# Patient Record
Sex: Male | Born: 1990 | Race: Black or African American | Hispanic: No | Marital: Single | Smoking: Current every day smoker
Health system: Southern US, Community
[De-identification: ages and names within clinical notes are randomized; demographics above are authoritative.]

## PROBLEM LIST (undated history)

## (undated) HISTORY — PX: CHEST SURGERY: SHX595

---
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
## 2010-08-24 LAB — URINE CULTURE: Culture: NO GROWTH

## 2010-08-24 LAB — URINALYSIS, ROUTINE W REFLEX MICROSCOPIC
Bilirubin Urine: NEGATIVE
Ketones, ur: NEGATIVE mg/dL
Protein, ur: NEGATIVE mg/dL
Urobilinogen, UA: 0.2 mg/dL (ref 0.0–1.0)

## 2010-08-24 LAB — GC/CHLAMYDIA PROBE AMP, GENITAL: Chlamydia, DNA Probe: NEGATIVE

## 2010-10-28 ENCOUNTER — Emergency Department (HOSPITAL_COMMUNITY): Payer: Self-pay

## 2010-10-28 ENCOUNTER — Emergency Department (HOSPITAL_COMMUNITY)
Admission: EM | Admit: 2010-10-28 | Discharge: 2010-10-28 | Disposition: A | Discharge status: Home / Self Care | Payer: Self-pay | Attending: Emergency Medicine | Admitting: Emergency Medicine

## 2010-10-28 DIAGNOSIS — S99919A Unspecified injury of unspecified ankle, initial encounter: Secondary | ICD-10-CM | POA: Insufficient documentation

## 2010-10-28 DIAGNOSIS — M25473 Effusion, unspecified ankle: Secondary | ICD-10-CM | POA: Insufficient documentation

## 2010-10-28 DIAGNOSIS — S8990XA Unspecified injury of unspecified lower leg, initial encounter: Secondary | ICD-10-CM | POA: Insufficient documentation

## 2010-10-28 DIAGNOSIS — M545 Low back pain, unspecified: Secondary | ICD-10-CM | POA: Insufficient documentation

## 2010-10-28 DIAGNOSIS — M25579 Pain in unspecified ankle and joints of unspecified foot: Secondary | ICD-10-CM | POA: Insufficient documentation

## 2010-10-28 DIAGNOSIS — M25569 Pain in unspecified knee: Secondary | ICD-10-CM | POA: Insufficient documentation

## 2010-10-28 DIAGNOSIS — IMO0002 Reserved for concepts with insufficient information to code with codable children: Secondary | ICD-10-CM | POA: Insufficient documentation

## 2010-10-28 DIAGNOSIS — M25476 Effusion, unspecified foot: Secondary | ICD-10-CM | POA: Insufficient documentation

## 2010-11-02 ENCOUNTER — Emergency Department (HOSPITAL_COMMUNITY)
Admission: EM | Admit: 2010-11-02 | Discharge: 2010-11-02 | Disposition: A | Discharge status: Home / Self Care | Payer: No Typology Code available for payment source | Attending: Emergency Medicine | Admitting: Emergency Medicine

## 2010-11-02 ENCOUNTER — Emergency Department (HOSPITAL_COMMUNITY): Payer: Self-pay

## 2010-11-02 DIAGNOSIS — M25579 Pain in unspecified ankle and joints of unspecified foot: Secondary | ICD-10-CM | POA: Insufficient documentation

## 2010-11-02 DIAGNOSIS — S93409A Sprain of unspecified ligament of unspecified ankle, initial encounter: Secondary | ICD-10-CM | POA: Insufficient documentation

## 2010-11-02 DIAGNOSIS — Z09 Encounter for follow-up examination after completed treatment for conditions other than malignant neoplasm: Secondary | ICD-10-CM | POA: Insufficient documentation

## 2010-11-02 DIAGNOSIS — M25529 Pain in unspecified elbow: Secondary | ICD-10-CM | POA: Insufficient documentation

## 2010-11-02 DIAGNOSIS — T148XXA Other injury of unspecified body region, initial encounter: Secondary | ICD-10-CM | POA: Insufficient documentation

## 2010-11-02 DIAGNOSIS — M25569 Pain in unspecified knee: Secondary | ICD-10-CM | POA: Insufficient documentation

## 2010-11-02 DIAGNOSIS — IMO0002 Reserved for concepts with insufficient information to code with codable children: Secondary | ICD-10-CM | POA: Insufficient documentation

## 2010-11-02 DIAGNOSIS — M545 Low back pain, unspecified: Secondary | ICD-10-CM | POA: Insufficient documentation

## 2012-11-17 ENCOUNTER — Emergency Department (HOSPITAL_COMMUNITY): Payer: No Typology Code available for payment source

## 2012-11-17 ENCOUNTER — Emergency Department (HOSPITAL_COMMUNITY)
Admission: EM | Admit: 2012-11-17 | Discharge: 2012-11-17 | Disposition: A | Discharge status: Home / Self Care | Payer: No Typology Code available for payment source | Attending: Emergency Medicine | Admitting: Emergency Medicine

## 2012-11-17 ENCOUNTER — Encounter (HOSPITAL_COMMUNITY): Payer: Self-pay | Admitting: Emergency Medicine

## 2012-11-17 DIAGNOSIS — S80812A Abrasion, left lower leg, initial encounter: Secondary | ICD-10-CM

## 2012-11-17 DIAGNOSIS — Y939 Activity, unspecified: Secondary | ICD-10-CM | POA: Insufficient documentation

## 2012-11-17 DIAGNOSIS — L089 Local infection of the skin and subcutaneous tissue, unspecified: Secondary | ICD-10-CM

## 2012-11-17 DIAGNOSIS — F17200 Nicotine dependence, unspecified, uncomplicated: Secondary | ICD-10-CM | POA: Insufficient documentation

## 2012-11-17 DIAGNOSIS — Z791 Long term (current) use of non-steroidal anti-inflammatories (NSAID): Secondary | ICD-10-CM | POA: Insufficient documentation

## 2012-11-17 DIAGNOSIS — Y9289 Other specified places as the place of occurrence of the external cause: Secondary | ICD-10-CM | POA: Insufficient documentation

## 2012-11-17 DIAGNOSIS — IMO0002 Reserved for concepts with insufficient information to code with codable children: Secondary | ICD-10-CM | POA: Insufficient documentation

## 2012-11-17 MED ORDER — SILVER SULFADIAZINE 1 % EX CREA
TOPICAL_CREAM | Freq: Once | CUTANEOUS | Status: DC
  Filled 2012-11-17: qty 50

## 2012-11-17 MED ORDER — OXYCODONE-ACETAMINOPHEN 5-325 MG PO TABS
1.0000 | ORAL_TABLET | Freq: Once | ORAL | Status: AC
  Administered 2012-11-17: 1 via ORAL
  Filled 2012-11-17: qty 1

## 2012-11-17 MED ORDER — SILVER SULFADIAZINE 1 % EX CREA: TOPICAL_CREAM | Freq: Two times a day (BID) | CUTANEOUS | Status: DC

## 2012-11-17 MED ORDER — IBUPROFEN 800 MG PO TABS: 800.0000 mg | ORAL_TABLET | Freq: Three times a day (TID) | ORAL | Status: DC | PRN

## 2012-11-17 MED ORDER — OXYCODONE-ACETAMINOPHEN 5-325 MG PO TABS: 1.0000 | ORAL_TABLET | Freq: Four times a day (QID) | ORAL | Status: DC | PRN

## 2012-11-17 MED ORDER — SILVER SULFADIAZINE 1 % EX CREA
TOPICAL_CREAM | Freq: Two times a day (BID) | CUTANEOUS | Status: DC
  Administered 2012-11-17: 16:00:00 via TOPICAL

## 2012-11-17 MED ORDER — TETANUS-DIPHTH-ACELL PERTUSSIS 5-2.5-18.5 LF-MCG/0.5 IM SUSP
0.5000 mL | Freq: Once | INTRAMUSCULAR | Status: DC
  Filled 2012-11-17: qty 0.5
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
## 2012-11-17 NOTE — Progress Notes (Signed)
[facility] has seen patient and provided him with a list of primary care resources. °
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
## 2013-01-28 ENCOUNTER — Emergency Department (HOSPITAL_COMMUNITY)
Admission: EM | Admit: 2013-01-28 | Discharge: 2013-01-29 | Disposition: A | Discharge status: Home / Self Care | Payer: Self-pay | Attending: Emergency Medicine | Admitting: Emergency Medicine

## 2013-01-28 DIAGNOSIS — R21 Rash and other nonspecific skin eruption: Secondary | ICD-10-CM | POA: Insufficient documentation

## 2013-01-28 DIAGNOSIS — L299 Pruritus, unspecified: Secondary | ICD-10-CM | POA: Insufficient documentation

## 2013-01-28 DIAGNOSIS — F17200 Nicotine dependence, unspecified, uncomplicated: Secondary | ICD-10-CM | POA: Insufficient documentation

## 2013-01-29 ENCOUNTER — Encounter (HOSPITAL_COMMUNITY): Payer: Self-pay | Admitting: Emergency Medicine

## 2013-01-29 MED ORDER — DIPHENHYDRAMINE HCL 25 MG PO TABS: 25.0000 mg | ORAL_TABLET | Freq: Four times a day (QID) | ORAL | Status: DC

## 2013-01-29 MED ORDER — CEPHALEXIN 500 MG PO CAPS: 500.0000 mg | ORAL_CAPSULE | Freq: Four times a day (QID) | ORAL | Status: DC

## 2013-01-29 NOTE — ED Notes (Signed)
[place], PA at bedside 
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
## 2013-01-29 NOTE — ED Provider Notes (Signed)
Medical screening examination/treatment/procedure(s) were performed by non-physician practitioner and as supervising physician I was immediately available for consultation/collaboration.  Cristofer Yaffe, MD 01/29/13 0415 

## 2013-01-29 NOTE — ED Notes (Signed)
Patient reports on yesterday he noticed a rash on his right foot. It started as small bumps that are now increasing in size. Patient states tonight he noticed they are now also on his Left foot. Patient states rash is itchy and painful to touch. Patient states he was recently in the woods but was wearing shoes. Denies any other potential exposure.

## 2013-01-29 NOTE — ED Notes (Signed)
Patient with small scattered bumps to bilateral ankles, patient states he also feels like they are on his back and butt, none visulaized.

## 2015-03-24 ENCOUNTER — Emergency Department (HOSPITAL_COMMUNITY)
Admission: EM | Admit: 2015-03-24 | Discharge: 2015-03-24 | Disposition: A | Discharge status: Home / Self Care | Payer: No Typology Code available for payment source | Attending: Emergency Medicine | Admitting: Emergency Medicine

## 2015-03-24 ENCOUNTER — Encounter (HOSPITAL_COMMUNITY): Payer: Self-pay | Admitting: Emergency Medicine

## 2015-03-24 DIAGNOSIS — Z720 Tobacco use: Secondary | ICD-10-CM | POA: Insufficient documentation

## 2015-03-24 DIAGNOSIS — Y9389 Activity, other specified: Secondary | ICD-10-CM | POA: Insufficient documentation

## 2015-03-24 DIAGNOSIS — S39012A Strain of muscle, fascia and tendon of lower back, initial encounter: Secondary | ICD-10-CM | POA: Diagnosis not present

## 2015-03-24 DIAGNOSIS — S5001XA Contusion of right elbow, initial encounter: Secondary | ICD-10-CM | POA: Diagnosis not present

## 2015-03-24 DIAGNOSIS — Y998 Other external cause status: Secondary | ICD-10-CM | POA: Diagnosis not present

## 2015-03-24 DIAGNOSIS — S4991XA Unspecified injury of right shoulder and upper arm, initial encounter: Secondary | ICD-10-CM | POA: Diagnosis not present

## 2015-03-24 DIAGNOSIS — Y92410 Unspecified street and highway as the place of occurrence of the external cause: Secondary | ICD-10-CM | POA: Diagnosis not present

## 2015-03-24 DIAGNOSIS — S29012A Strain of muscle and tendon of back wall of thorax, initial encounter: Secondary | ICD-10-CM | POA: Insufficient documentation

## 2015-03-24 DIAGNOSIS — S59901A Unspecified injury of right elbow, initial encounter: Secondary | ICD-10-CM | POA: Diagnosis present

## 2015-03-24 MED ORDER — METHOCARBAMOL 500 MG PO TABS
500.0000 mg | ORAL_TABLET | Freq: Once | ORAL | Status: AC
  Administered 2015-03-24: 500 mg via ORAL
  Filled 2015-03-24: qty 1

## 2015-03-24 MED ORDER — METHOCARBAMOL 500 MG PO TABS: 500.0000 mg | ORAL_TABLET | Freq: Two times a day (BID) | ORAL | Status: DC

## 2015-03-24 MED ORDER — NAPROXEN 500 MG PO TABS: 500.0000 mg | ORAL_TABLET | Freq: Two times a day (BID) | ORAL | Status: DC

## 2015-03-24 MED ORDER — NAPROXEN 500 MG PO TABS
500.0000 mg | ORAL_TABLET | Freq: Once | ORAL | Status: AC
  Administered 2015-03-24: 500 mg via ORAL
  Filled 2015-03-24: qty 1
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
## 2015-03-24 NOTE — ED Notes (Signed)
Ortho Tech called & informed of ace order.  Spoke with Eli Lilly and CompanyBenedict.

## 2015-04-03 ENCOUNTER — Emergency Department (HOSPITAL_COMMUNITY)
Admission: EM | Admit: 2015-04-03 | Discharge: 2015-04-04 | Disposition: A | Discharge status: Home / Self Care | Payer: No Typology Code available for payment source | Attending: Emergency Medicine | Admitting: Emergency Medicine

## 2015-04-03 ENCOUNTER — Encounter (HOSPITAL_COMMUNITY): Payer: Self-pay | Admitting: Emergency Medicine

## 2015-04-03 ENCOUNTER — Emergency Department (HOSPITAL_COMMUNITY): Payer: No Typology Code available for payment source

## 2015-04-03 DIAGNOSIS — F17210 Nicotine dependence, cigarettes, uncomplicated: Secondary | ICD-10-CM | POA: Insufficient documentation

## 2015-04-03 DIAGNOSIS — X500XXA Overexertion from strenuous movement or load, initial encounter: Secondary | ICD-10-CM | POA: Diagnosis not present

## 2015-04-03 DIAGNOSIS — Y9289 Other specified places as the place of occurrence of the external cause: Secondary | ICD-10-CM | POA: Insufficient documentation

## 2015-04-03 DIAGNOSIS — M25521 Pain in right elbow: Secondary | ICD-10-CM

## 2015-04-03 DIAGNOSIS — Y9389 Activity, other specified: Secondary | ICD-10-CM | POA: Diagnosis not present

## 2015-04-03 DIAGNOSIS — S59901A Unspecified injury of right elbow, initial encounter: Secondary | ICD-10-CM | POA: Insufficient documentation

## 2015-04-03 DIAGNOSIS — Y990 Civilian activity done for income or pay: Secondary | ICD-10-CM | POA: Insufficient documentation
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
## 2015-04-04 NOTE — Discharge Instructions (Signed)
Ibuprofen 600 mg every 6 hours as needed for pain.  Follow-up with your primary Dr. if not improving in the next week.   Joint Pain Joint pain, which is also called arthralgia, can be caused by many things. Joint pain often goes away when you follow your health care provider's instructions for relieving pain at home. However, joint pain can also be caused by conditions that require further treatment. Common causes of joint pain include:  Bruising in the area of the joint.  Overuse of the joint.  Wear and tear on the joints that occur with aging (osteoarthritis).  Various other forms of arthritis.  A buildup of a crystal form of uric acid in the joint (gout).  Infections of the joint (septic arthritis) or of the bone (osteomyelitis). Your health care provider may recommend medicine to help with the pain. If your joint pain continues, additional tests may be needed to diagnose your condition. HOME CARE INSTRUCTIONS Watch your condition for any changes. Follow these instructions as directed to lessen the pain that you are feeling.  Take medicines only as directed by your health care provider.  Rest the affected area for as long as your health care provider says that you should. If directed to do so, raise the painful joint above the level of your heart while you are sitting or lying down.  Do not do things that cause or worsen pain.  If directed, apply ice to the painful area:  Put ice in a plastic bag.  Place a towel between your skin and the bag.  Leave the ice on for 20 minutes, 2-3 times per day.  Wear an elastic bandage, splint, or sling as directed by your health care provider. Loosen the elastic bandage or splint if your fingers or toes become numb and tingle, or if they turn cold and blue.  Begin exercising or stretching the affected area as directed by your health care provider. Ask your health care provider what types of exercise are safe for you.  Keep all follow-up  visits as directed by your health care provider. This is important. SEEK MEDICAL CARE IF:  Your pain increases, and medicine does not help.  Your joint pain does not improve within 3 days.  You have increased bruising or swelling.  You have a fever.  You lose 10 lb (4.5 kg) or more without trying. SEEK IMMEDIATE MEDICAL CARE IF:  You are not able to move the joint.  Your fingers or toes become numb or they turn cold and blue.   This information is not intended to replace advice given to you by your health care provider. Make sure you discuss any questions you have with your health care provider.   Document Released: 04/30/2005 Document Revised: 05/21/2014 Document Reviewed: 02/09/2014 Elsevier Interactive Patient Education [employer]

## 2015-06-20 ENCOUNTER — Encounter (HOSPITAL_COMMUNITY): Payer: Self-pay | Admitting: Emergency Medicine

## 2015-06-20 ENCOUNTER — Emergency Department (HOSPITAL_COMMUNITY)
Admission: EM | Admit: 2015-06-20 | Discharge: 2015-06-20 | Disposition: A | Discharge status: Home / Self Care | Payer: No Typology Code available for payment source | Attending: Emergency Medicine | Admitting: Emergency Medicine

## 2015-06-20 DIAGNOSIS — K029 Dental caries, unspecified: Secondary | ICD-10-CM | POA: Insufficient documentation

## 2015-06-20 DIAGNOSIS — Z791 Long term (current) use of non-steroidal anti-inflammatories (NSAID): Secondary | ICD-10-CM | POA: Insufficient documentation

## 2015-06-20 DIAGNOSIS — M26622 Arthralgia of left temporomandibular joint: Secondary | ICD-10-CM | POA: Insufficient documentation

## 2015-06-20 DIAGNOSIS — Z79899 Other long term (current) drug therapy: Secondary | ICD-10-CM | POA: Insufficient documentation

## 2015-06-20 DIAGNOSIS — F17210 Nicotine dependence, cigarettes, uncomplicated: Secondary | ICD-10-CM | POA: Insufficient documentation

## 2015-06-20 MED ORDER — NAPROXEN 500 MG PO TABS: 500.0000 mg | ORAL_TABLET | Freq: Two times a day (BID) | ORAL | Status: AC

## 2015-06-20 MED ORDER — METHOCARBAMOL 500 MG PO TABS: 500.0000 mg | ORAL_TABLET | Freq: Three times a day (TID) | ORAL | Status: AC | PRN
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
## 2015-06-20 NOTE — ED Notes (Addendum)
Left inner ear pain x 3 days. Denies nasal congestion, sore throat, cough, fever. Pain radiating into lower jaw. Says he thinks he has an ear infection. Skin around ear drum appears reddened/irritated

## 2015-06-20 NOTE — Discharge Instructions (Signed)
Read the information below.  Use the prescribed medication as directed.  Please discuss all new medications with your pharmacist.  You may return to the Emergency Department at any time for worsening condition or any new symptoms that concern you.    Please call the dentist listed above to schedule a close follow up appointment.  If you develop fevers, swelling in your face, difficulty swallowing or breathing, return to the ER immediately for a recheck.      Dental Caries Dental caries (also called tooth decay) is the most common oral disease. It can occur at any age but is more common in children and young adults.  HOW DENTAL CARIES DEVELOPS  The process of decay begins when bacteria and foods (particularly sugars and starches) combine in your mouth to produce plaque. Plaque is a substance that sticks to the hard, outer surface of a tooth (enamel). The bacteria in plaque produce acids that attack enamel. These acids may also attack the root surface of a tooth (cementum) if it is exposed. Repeated attacks dissolve these surfaces and create holes in the tooth (cavities). If left untreated, the acids destroy the other layers of the tooth.  RISK FACTORS  Frequent sipping of sugary beverages.   Frequent snacking on sugary and starchy foods, especially those that easily get stuck in the teeth.   Poor oral hygiene.   Dry mouth.   Substance abuse such as methamphetamine abuse.   Broken or poor-fitting dental restorations.   Eating disorders.   Gastroesophageal reflux disease (GERD).   Certain radiation treatments to the head and neck. SYMPTOMS In the early stages of dental caries, symptoms are seldom present. Sometimes white, chalky areas may be seen on the enamel or other tooth layers. In later stages, symptoms may include:  Pits and holes on the enamel.  Toothache after sweet, hot, or cold foods or drinks are consumed.  Pain around the tooth.  Swelling around the  tooth. DIAGNOSIS  Most of the time, dental caries is detected during a regular dental checkup. A diagnosis is made after a thorough medical and dental history is taken and the surfaces of your teeth are checked for signs of dental caries. Sometimes special instruments, such as lasers, are used to check for dental caries. Dental X-ray exams may be taken so that areas not visible to the eye (such as between the contact areas of the teeth) can be checked for cavities.  TREATMENT  If dental caries is in its early stages, it may be reversed with a fluoride treatment or an application of a remineralizing agent at the dental office. Thorough brushing and flossing at home is needed to aid these treatments. If it is in its later stages, treatment depends on the location and extent of tooth destruction:   If a small area of the tooth has been destroyed, the destroyed area will be removed and cavities will be filled with a material such as gold, silver amalgam, or composite resin.   If a large area of the tooth has been destroyed, the destroyed area will be removed and a cap (crown) will be fitted over the remaining tooth structure.   If the center part of the tooth (pulp) is affected, a procedure called a root canal will be needed before a filling or crown can be placed.   If most of the tooth has been destroyed, the tooth may need to be pulled (extracted). HOME CARE INSTRUCTIONS You can prevent, stop, or reverse dental caries at home  by practicing good oral hygiene. Good oral hygiene includes:  Thoroughly cleaning your teeth at least twice a day with a toothbrush and dental floss.   Using a fluoride toothpaste. A fluoride mouth rinse may also be used if recommended by your dentist or health care provider.   Restricting the amount of sugary and starchy foods and sugary liquids you consume.   Avoiding frequent snacking on these foods and sipping of these liquids.   Keeping regular visits with a  dentist for checkups and cleanings. PREVENTION   Practice good oral hygiene.  Consider a dental sealant. A dental sealant is a coating material that is applied by your dentist to the pits and grooves of teeth. The sealant prevents food from being trapped in them. It may protect the teeth for several years.  Ask about fluoride supplements if you live in a community without fluorinated water or with water that has a low fluoride content. Use fluoride supplements as directed by your dentist or health care provider.  Allow fluoride varnish applications to teeth if directed by your dentist or health care provider.   This information is not intended to replace advice given to you by your health care provider. Make sure you discuss any questions you have with your health care provider.   Document Released: 01/20/2002 Document Revised: 05/21/2014 Document Reviewed: 05/02/2012 Elsevier Interactive Patient Education 2016 [employer].    Emergency Department Resource Guide 1) Find a Doctor and Pay Out of Pocket Although you won't have to find out who is covered by your insurance plan, it is a good idea to ask around and get recommendations. You will then need to call the office and see if the doctor you have chosen will accept you as a new patient and what types of options they offer for patients who are self-pay. Some doctors offer discounts or will set up payment plans for their patients who do not have insurance, but you will need to ask so you aren't surprised when you get to your appointment.  2) Contact Your Local Health Department Not all health departments have doctors that can see patients for sick visits, but many do, so it is worth a call to see if yours does. If you don't know where your local health department is, you can check in your phone book. The CDC also has a tool to help you locate your state's health department, and many state websites also have listings of all of their local  health departments.  3) Find a Walk-in Clinic If your illness is not likely to be very severe or complicated, you may want to try a walk in clinic. These are popping up all over the country in pharmacies, drugstores, and shopping centers. They're usually staffed by nurse practitioners or physician assistants that have been trained to treat common illnesses and complaints. They're usually fairly quick and inexpensive. However, if you have serious medical issues or chronic medical problems, these are probably not your best option.  No Primary Care Doctor: - Call Health Connect at  [phone] - they can help you locate a primary care doctor that  accepts your insurance, provides certain services, etc. - Physician Referral Service- [phone]  Chronic Pain Problems: Organization         Address  Phone   Notes  [facility]  [phone] Patients need to be referred by their primary care doctor.   Medication Assistance: Organization         Address  Phone   Notes  [facility] Medication [facility] [address] [place], [place] [address] [phone] --Must be a resident of [facility] -- Must have NO insurance coverage whatsoever (no Medicaid/ Medicare, etc.) -- The pt. MUST have a primary care doctor that directs their care regularly and follows them in the community   [facility]  [phone]   [employer]  [phone]    Agencies that provide inexpensive medical care: Organization         Address  Phone   Notes  [facility]  [phone]   [facility]    [phone]   [facility] [address] [place], [place] [address] [phone]   [facility] [address] [place] [phone]   Planned Parenthood    [phone]   [facility]    [phone]   [facility]  [address], [place] Phone:   [phone], Fax:  [phone] Hours of Operation:  9 am - 6 pm, M-F.  Also accepts Medicaid/Medicare and self-pay.  [facility]  [address], [place] Phone: [phone], Fax: [phone]. Hours of Operation:  8:30 am - 5:30 pm, M-F.  Also accepts Medicaid and self-pay.  [facility] [place] [address], [place] Phone: [phone]   [facility] [address] Natasha Bence [place], [place] [phone], Ext. [phone] Mondays & Thursdays: 7-9 AM.  First 15 patients are seen on a first come, first serve basis.    Medicaid-accepting [facility] Providers:  Organization         Address  Phone   Notes  [facility] [address], [place] [phone] Also accepts self-pay patients.  [facility] [address] Laurell Josephs [place], [place]  [phone]   [facility] [address], [place] [phone]   [facility] [address], [place] [phone]   Renaye Rakers [address], [place]   [phone] Only accepts [place] Access [place] patients after they have their name applied to their card.   Self-Pay (no insurance) in [facility]:  Organization         Address  Phone   Notes  Sickle Cell Patients, [facility] Internal Medicine [address] [place], [place] [phone]   [facility] [address] [place], [place] [phone]   [facility] [place]  [address], [place] [phone]   [facility]/Dr. Osei-Bonsu  [address] [place] or [address], [place] [phone] Phone number for both [place] and [place] locations is the same.  [facility] [address] [place] [phone]   [facility] [address] [place] or [address] [phone]   [facility] [address], [place] [phone], phone; [phone], fax Sees patients 1st and 3rd Saturday of every month.  Must not qualify for public or private insurance (i.e. Medicaid, Medicare,  Health Choice, Veterans' Benefits)  Household income should be no more than 200% of the poverty level The clinic cannot treat you if you are pregnant or think you are pregnant  Sexually transmitted diseases are not treated at the clinic.    Dental Care: Organization         Address  Phone  Notes  [facility] Department of [facility] [place] [phone] Accepts children up to age 24 who are enrolled in [place] or [place]; pregnant women with a Medicaid card; and children who have applied for Medicaid or [place] Health Choice, but were declined, whose parents can pay a reduced fee at time of service.  [facility] of [facility]  [address], [place] [phone] Accepts children up to age 66 who are enrolled in [place] or [place]; pregnant women with a Medicaid card; and children who have applied for Medicaid or [place] Health Choice, but were declined, whose parents can pay a reduced fee at time of service.  [facility]  [place] [phone] Patients are seen by appointment only. Walk-ins are not accepted. [place] will see patients 80 years of age and older. Monday - Tuesday (8am-5pm) Most Wednesdays (8:30-5pm) $30 per visit, cash only  [facility] Adult Dental Access PROGRAM  [address], [facility] [phone] Patients are seen by appointment only. Walk-ins are not accepted. [place] will see patients 68 years of age and older. One Wednesday Evening (Monthly: Volunteer Based).  $30 per visit, cash only  [employer]  [phone] for adults;  Children under age 32, call Graduate Pediatric Dentistry at [phone]. Children aged 8-14, please call [phone] to request a pediatric application.  Dental services are provided in all areas of dental care including fillings, crowns and bridges, complete and partial dentures, implants, gum treatment, root canals, and extractions. Preventive care is also provided. Treatment is provided to both adults and children. Patients are selected via a lottery and there is often a waiting list.   [facility] [address], [place]  [phone] [email or web address]   [facility] [address] [place], [place] [phone], Ext. [phone] Second and Fourth Thursday of each month, opens at 6:30 AM; Clinic ends at 9 AM.  Patients are seen on a first-come first-served basis, and a limited number are seen during each clinic.   [facility]  [address] Hillard Danker [place], [place] [phone]   Eligibility Requirements You must have lived in [place], [place], or [place] counties for at least the last three months.   You cannot be eligible for state or federal sponsored [employer], including [employer], [place], or [employer].   You generally cannot be eligible for healthcare insurance through your employer.    How to apply: Eligibility screenings are held every Tuesday and Wednesday afternoon from 1:00 pm until 4:00 pm. You do not need an appointment for the interview!  [facility] [address] [place], [place]   [place]  [place] Department  [place]  [phone]    Behavioral Health Resources in the Community: Intensive Outpatient Programs Organization         Address  Phone  Notes  [place] [address], [place], [place] [phone]   [facility] Outpatient [address] [place], [place]   [facility] [address], [place], [place]  [place] [address],  [place], [place] or [phone]   Substance Abuse Resources Organization         Address  Phone  Notes  [facility] Drug [facility]  [phone]   [place]  [phone]   [facility] [place] [facility]   Chinita Pester  [phone]   Residential & Outpatient Substance Abuse Program  [phone]   Psychological Services Organization         Address  Phone  Notes  [facility]  [place]  [phone]   [place] [address], [place] or [phone]    Mobile Crisis Teams Organization         Address  Phone  Notes  [facility], Mobile Crisis Care Unit  [phone]   [facility]  [address] [place], [place]   [place] Levels [address], [place] [phone]    Self-Help/Support Groups Organization         Address  Phone             Notes  [place]. of [place] - variety of support groups  [place] Call for more information  Narcotics Anonymous (NA), [facility] [address], [employer] [place]  2 meetings at this location   Special educational needs teacher         Address  Phone  Notes  [facility] [place],    [place]  [phone]   [facility]  [address], [place] [address], [place], [place]   [place], [place] [phone] Admissions: 8am-3pm M-F  Incentives Substance [place] [address],    Kamiyah Kindel [place], [place] [phone]   [facility] [address] [place], [place], [place]   [facility] [address],  [place], [place]   [facility] - Intensive  Outpatient [address] Kristeen Mans [address], [place], [place]   [facility] ([place].) [place]  [place], [place] [phone] or [phone]   [facility] ([facility]) [address], [place], [place] Accepts Medicaid  [facility] [place] [address],  [place] [phone] Substance Abuse/Addiction Treatment   [facility] Organization         Address  Phone  Notes  [facility]  [phone]   Domenic Schwab, PhD [address] Arlis Porta [place], [place]   [phone] or [phone]   Simpson Hyde Sully [place], [place] [phone]   [facility] [address], [place], [place] [phone] Insurance/Medicaid/sponsorship through [facility] and Families [address]                                    [place], [place] [phone] [place] [place] [address] [place], [place] [phone]    Dr. Adele Schilder  [phone]   [facility] Dept. 1) [address], [place] 2) [place] 3)  [place] [address], [place] [phone]  [phone]   [place] [phone] or [phone] (  After Hours)    ° ° ° °
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
